# Patient Record
Sex: Male | Born: 1940 | Race: White | Hispanic: No | Smoking: Never smoker
Health system: Southern US, Community
[De-identification: ages and names within clinical notes are randomized; demographics above are authoritative.]

## PROBLEM LIST (undated history)

## (undated) DIAGNOSIS — C44519 Basal cell carcinoma of skin of other part of trunk: Secondary | ICD-10-CM

## (undated) DIAGNOSIS — K219 Gastro-esophageal reflux disease without esophagitis: Secondary | ICD-10-CM

## (undated) DIAGNOSIS — J309 Allergic rhinitis, unspecified: Secondary | ICD-10-CM

## (undated) DIAGNOSIS — E559 Vitamin D deficiency, unspecified: Secondary | ICD-10-CM

## (undated) DIAGNOSIS — N400 Enlarged prostate without lower urinary tract symptoms: Secondary | ICD-10-CM

## (undated) DIAGNOSIS — R7989 Other specified abnormal findings of blood chemistry: Secondary | ICD-10-CM

## (undated) DIAGNOSIS — M5430 Sciatica, unspecified side: Secondary | ICD-10-CM

## (undated) DIAGNOSIS — I10 Essential (primary) hypertension: Secondary | ICD-10-CM

## (undated) DIAGNOSIS — N529 Male erectile dysfunction, unspecified: Secondary | ICD-10-CM

## (undated) DIAGNOSIS — M810 Age-related osteoporosis without current pathological fracture: Secondary | ICD-10-CM

## (undated) HISTORY — DX: Gastro-esophageal reflux disease without esophagitis: K21.9

## (undated) HISTORY — DX: Allergic rhinitis, unspecified: J30.9

## (undated) HISTORY — DX: Basal cell carcinoma of skin of other part of trunk: C44.519

## (undated) HISTORY — DX: Benign prostatic hyperplasia without lower urinary tract symptoms: N40.0

## (undated) HISTORY — DX: Essential (primary) hypertension: I10

## (undated) HISTORY — DX: Other specified abnormal findings of blood chemistry: R79.89

## (undated) HISTORY — PX: VASECTOMY: SHX75

## (undated) HISTORY — DX: Age-related osteoporosis without current pathological fracture: M81.0

## (undated) HISTORY — DX: Male erectile dysfunction, unspecified: N52.9

## (undated) HISTORY — DX: Vitamin D deficiency, unspecified: E55.9

## (undated) HISTORY — PX: COLONOSCOPY: SHX174

## (undated) HISTORY — DX: Sciatica, unspecified side: M54.30

---
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
## 2004-04-02 ENCOUNTER — Observation Stay (HOSPITAL_COMMUNITY): Admission: EM | Admit: 2004-04-02 | Discharge: 2004-04-03 | Payer: Self-pay | Admitting: Emergency Medicine

## 2005-03-02 DIAGNOSIS — D369 Benign neoplasm, unspecified site: Secondary | ICD-10-CM

## 2005-03-02 HISTORY — DX: Benign neoplasm, unspecified site: D36.9

## 2007-02-11 ENCOUNTER — Encounter: Admission: RE | Admit: 2007-02-11 | Discharge: 2007-02-11 | Payer: Self-pay | Admitting: Specialist

## 2007-03-30 ENCOUNTER — Encounter: Admission: RE | Admit: 2007-03-30 | Discharge: 2007-03-30 | Payer: Self-pay | Admitting: Specialist

## 2007-04-18 ENCOUNTER — Encounter: Admission: RE | Admit: 2007-04-18 | Discharge: 2007-04-18 | Payer: Self-pay | Admitting: Specialist

## 2010-07-18 NOTE — Consult Note (Signed)
[name] NO.:  [id number]   MEDICAL RECORD NO.:  [id number]          PATIENT TYPE:  INP   LOCATION:  2022                         FACILITY:  [facility]   PHYSICIAN:  Armanda Magic, M.D.     DATE OF BIRTH:  [birth year]   DATE OF CONSULTATION:  04/02/2004  DATE OF DISCHARGE:                                   CONSULTATION   REFERRING PHYSICIAN:  Dr. Kela Millin, M.D.   PRIMARY CARE PHYSICIAN:  Dr. Dayton Scrape.   REASON FOR CONSULTATION:  Chest pain.   This is a pleasant 70 year old male with a family history of coronary  disease.  Also a history of abnormal lipids, who was admitted to the  hospital by [facility] with complaints of chest pain.  Apparently,  he was lying on the floor last evening, watching TV, and when he went to get  up, he noticed mid and left-sided chest pressure with no radiation.  He did  not have any associated symptoms of nausea, vomiting, or diaphoresis.  After  about 15 minutes, the pain resolved, and the patient went to bed around 10  p.m.  During the night, each time he would move in the bed, the pain would  increase and then decrease once his position settled.  The pain continued  into the a.m. but waxed and waned, about a 2/5.  The pain would increased  with deep breathing or arm movement.  Palpation over the anterior chest  reproduces the pain.  He received one sublingual nitroglycerin in the  emergency room with only subtle decrease in the pain.   PAST MEDICAL HISTORY:  Significant for dyslipidemia, BPH, and basal cell CA.   ALLERGIES:  None.   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  He is married.  He denies tobacco  or alcohol use.  He is very active and works out at [employer] 2-3 times per  week.   MEDICATIONS:  1.  Hytrin 5 mg a day.  2.  Pepcid AC b.i.d.  3.  Zocor 20 mg a day.  4.  Aspirin 81 mg a day.  5.  Multivitamins.  6.   Metamucil.   FAMILY HISTORY:  His mother died at 63 of an MI.  The first MI was at age  62.  His father died at 66 from COPD and hypertension.  His brother had an  MI at 69.   REVIEW OF SYSTEMS:  Negative except for occasional indigestion and low back  pain.   PHYSICAL EXAMINATION:  VITAL SIGNS:  Blood pressure 119/69, pulse 76.  Afebrile.  Respirations 10.  GENERAL:  HEENT:  Benign.  NECK:  Supple without lymphadenopathy, carotid upstrokes +2, benign, no  bruits.  LUNGS:  Clear to auscultation throughout.  HEART:  Regular rate and rhythm.  No murmurs, rubs or gallops.  Normal S1  and S2.  Positive tenderness to palpation over the mid and left-sided chest.  ABDOMEN:  Soft and nontender, nondistended.  Active bowel sounds.  No  hepatosplenomegaly.  EXTREMITIES:  No edema.  Good distal pulses.   INR 1.  White cell count 4.8, platelet count 258.  Hemoglobin 15.8,  hematocrit 45, white cell count 4.8, myoglobin 68.9, MB 1.3.  Troponin less  than 0.05.  D-dimer is pending.  Sodium 139, potassium 3.8, chloride 106,  bicarb 25, BUN 12, creatinine 0.5.  LFTs normal.   EKG shows sinus rhythm with a slight T wave inversion in aVL, otherwise  normal EKG.  QTC is normal at 422 milliseconds.   ASSESSMENT:  1.  Chest pain, atypical, and reproducible with palpation.  He has a      nonspecific T wave inversion at aVL.  This is unlikely representing      underlying ischemia.  Cardiac risk factors include his age, sex,      hyperlipidemia, and family history of coronary disease.  2.  Dyslipidemia:  On Zocor.   PLAN:  1.  Would rule out myocardial infarction with serial enzymes.  If cardiac      enzymes negative x3, would plan stress Cardiolite study in the morning.      Agree with continuing subcu Lovenox and nitro at this time.  2.  Continue Zocor.      TT/MEDQ  D:  04/02/2004  T:  04/02/2004  Job:  [id number]   cc:   Dr. Dayton Scrape

## 2010-07-18 NOTE — Discharge Summary (Signed)
NAMEANDRZEJ, SCULLY NO.:  [id number]   MEDICAL RECORD NO.:  [id number]          PATIENT TYPE:  INP   LOCATION:  2022                         FACILITY:  [facility]   PHYSICIAN:  Theone Stanley, MD   DATE OF BIRTH:  1940-09-04   DATE OF ADMISSION:  04/02/2004  DATE OF DISCHARGE:                                 DISCHARGE SUMMARY   ADMISSION DIAGNOSES:  1.  Atypical chest pain.  2.  Hyperlipidemia.  3.  Benign prostatic hypertrophy.  4.  Dyspepsia.   DISCHARGE DIAGNOSES:  1.  Atypical chest pain, non cardiac in origin.  2.  Hyperlipidemia.  3.  Benign prostatic hypertrophy.  4.  Dyspepsia.   CONSULTATIONS:  Dr. Mayford Knife.   PROCEDURES AND DIAGNOSTIC TESTS:  The patient had a Cardiolite stress test  which did not show any evidence of ischemia and preliminary reports it is  normal.   HOSPITAL COURSE:  Mr. Cianci is a very pleasant 70 year old gentleman who  presented on February 1, complaining of chest pain that happened the night  before his admission.  He described it as an achey chest pain in his mid  chest.  He did not think much of it.  He went to sleep that night, however  he woke up several times with the chest pain, however he did not know  whether this was secondary to the pain or he just was waking up.  He woke up  with pain in the morning and he continued to have the pain so he decided to  come into the hospital.  The pain comes and goes but mainly happens when he  lies down.  The pain is somewhat reproducible when he pressed on this chest.  He has no radiation, no nausea, no vomiting, no diaphoresis with this.  He  states this has never happened to him before.  Because of his multiple risk  factors, including male, age, hyperlipidemia and family history, it was felt  that the patient would be admitted for rule out MI.  The patient was  admitted to telemetry.  There was no abnormalities on telemetry.  His  enzymes were negative, but cardiology was kind  enough to come by and a  Cardiolite stress test was performed which again did not show any evidence  of ischemia.  Because of this, it was felt that he could go home.  There  were no medications added to his home regimen.   DISCHARGE MEDICATIONS:  1.  Hydra 5 mg 1 p.o. q.h.s.  2.  Pepcid 20 mg 1 p.o. daily.  3.  Zocor 20 mg 1 p.o. q.h.s.  4.  Aspirin 81 mg 1 p.o. daily  5.  Multivitamin and Metamucil daily.   DISCHARGE INSTRUCTIONS:  The patient is to resume a healthy low-fat diet,  follow up with Dr. Dayton Scrape in 2-3 weeks and Dr. Mayford Knife on a p.r.n. basis.   DISPOSITION:   DISCHARGE MEDICATIONS:   SUMMARY:      AEJ/MEDQ  D:  04/03/2004  T:  04/03/2004  Job:  [id number]   cc:  Meredith Staggers, M.D.  [address], [address]  [place]  [place] [address]  Fax: [phone]

## 2010-07-18 NOTE — H&P (Signed)
[name], [name] NO.:  [id number]   MEDICAL RECORD NO.:  [id number]          PATIENT TYPE:  EMS   LOCATION:  MAJO                         FACILITY:  [facility]   PHYSICIAN:  Theone Stanley, MD   DATE OF BIRTH:  11-10-1940   DATE OF ADMISSION:  04/02/2004  DATE OF DISCHARGE:                                HISTORY & PHYSICAL   CHIEF COMPLAINT:  Chest pain.   HISTORY OF PRESENT ILLNESS:  This is a very pleasant 70 year old gentleman  who presents with chest pain since last night.  His pain is mid chest.  He  was lying on the floor when this happened and he did not think much of it so  he went to sleep.  He woke up several times last night with pain, however,  he does not state that the pain woke him up.  He woke up this morning and  with the continued pain he decided to come into the hospital at that time.  He describes the pain as achy, it kind of comes and goes, and mainly happens  when he lays down.  It does not improve with certain with certain positions.  He has no radiation, no nausea or vomiting, no diaphoresis and it does not  feel like regurgitation and it has never happened before.   PAST MEDICAL HISTORY:  1.  Hyperlipidemia.  2.  BPH.  3.  History of squamous cell CA.  4.  Some back pain.  5.  Dyspepsia.   SURGICAL HISTORY:  Tonsillectomy.   ALLERGIES:  NONE.   MEDICATIONS:  1.  Hytrin 5 mg one p.o. q.h.s.  2.  Pepcid 20 mg one daily.  3.  Zocor 20 mg one p.o. q.h.s.  4.  Aspirin 81 mg p.o. daily.  5.  Multivitamin one p.o. daily.  6.  Metamucil daily.   FAMILY HISTORY:  Mother passed away at age 45 from an MI, her first MI was  in her 15s.  Father passed away at age 74 from COPD, he had a history of  hypertension.  The patient has a brother who had his first MI at age 13.   SOCIAL HISTORY:  The patient lives in [place], is married and has  two grown children.  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  He denies any tobacco, alcohol or illicit drug use.   REVIEW OF SYSTEMS:  General, cardiac, respiratory, GU, GI, and endocrine  were all negative.   PHYSICAL EXAMINATION:  VITAL SIGNS:  Blood pressure 165/85, pulse 76,  temperature 98, saturating 95% on room air.  HEENT:  Head normocephalic, atraumatic.  Pupils equal round and reactive.  Oropharynx was clear.  NECK:  Supple, no lymphadenopathy.  HEART:  Regular rate and rhythm, no murmurs, rubs or gallops appreciated.  LUNGS:  Clear to auscultation bilaterally.  ABDOMEN:  Soft, nontender, non-distended.  EXTREMITIES:  No clubbing, cyanosis or edema.  2+ dorsalis pedis pulses.   LABORATORY DATA:  White count 4.8, hemoglobin 15, hematocrit 45, platelets  258,000.  PT 13, PTT 29, CK 1.3, troponin less than 0.05.  Sodium 139,  potassium 0.8, chloride 106, CO2 25, glucose 110, BUN 12, creatinine 0.9,  calcium 9.3, total protein 6.1, albumin 3.9, AST 26, ALT 21, alk phos 56,  total bilirubin 0.9.   Chest x-ray preliminary report shows mild cardiomegaly, possible  COPD/emphysema, no acute changes.   ASSESSMENT AND PLAN:  1.  Atypical chest pain.  Based on the patient's history, I do not feel that      this is typical cardiac etiology, however, because of his strong family      history he will be admitted, rule out myocardial infarction, we will ask      cardiology to help out, especially with nuclear medicine study or any      other study they feel necessary for this patient.  2.  Benign prostatic hypertrophy.  We will continue with his Hytrin.  3.  Hyperlipidemia.  We will continue with his Zocor.  4.  Dyspepsia.  We will continue with his Pepcid.      AEJ/MEDQ  D:  04/02/2004  T:  04/02/2004  Job:  [id number]   cc:   Meredith Staggers, M.D.  [address]  [place]  [place] [address]  Fax: [phone]

## 2014-06-01 ENCOUNTER — Ambulatory Visit
Admission: RE | Admit: 2014-06-01 | Discharge: 2014-06-01 | Disposition: A | Discharge status: Home / Self Care | Payer: Medicare Other | Source: Ambulatory Visit | Attending: Family Medicine | Admitting: Family Medicine

## 2014-06-01 ENCOUNTER — Other Ambulatory Visit: Payer: Self-pay | Admitting: Family Medicine

## 2014-06-01 DIAGNOSIS — R609 Edema, unspecified: Secondary | ICD-10-CM

## 2014-06-01 DIAGNOSIS — M25571 Pain in right ankle and joints of right foot: Secondary | ICD-10-CM

## 2015-07-01 DEATH — deceased

## 2016-09-30 IMAGING — CR DG ANKLE COMPLETE 3+V*R*
3 series · 3 of 3 positions shown · non-contrast
Comparison: None.

CLINICAL DATA: Medial right ankle pain and swelling for 3 weeks.

EXAM:
RIGHT ANKLE - COMPLETE 3+ VIEW

[t ankle joint ap right]
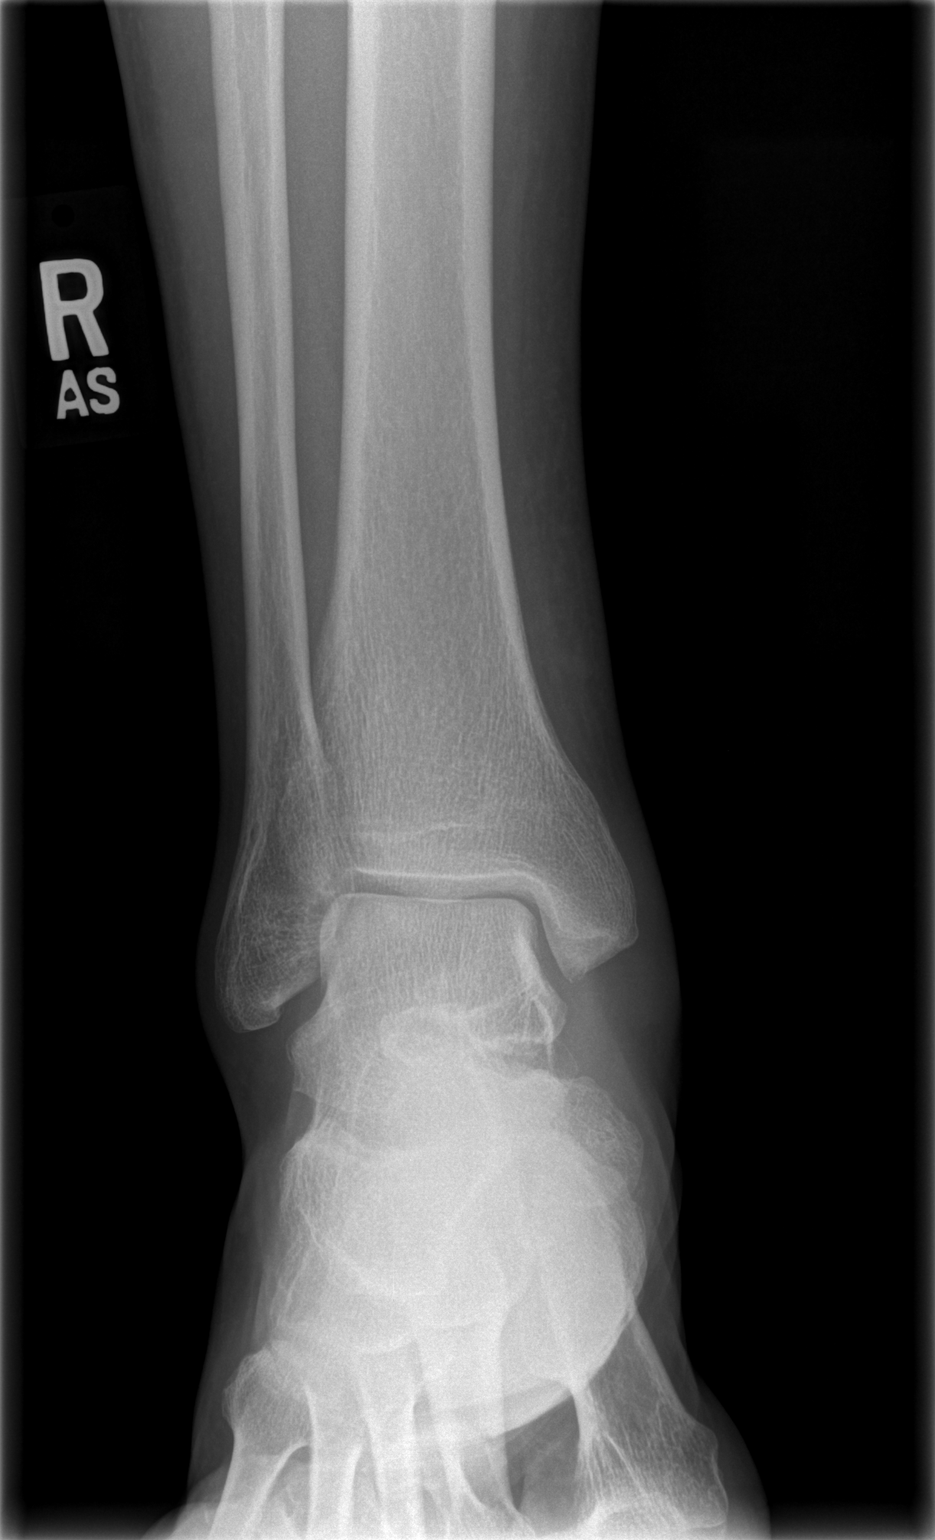

[t ankle joint oblique right]
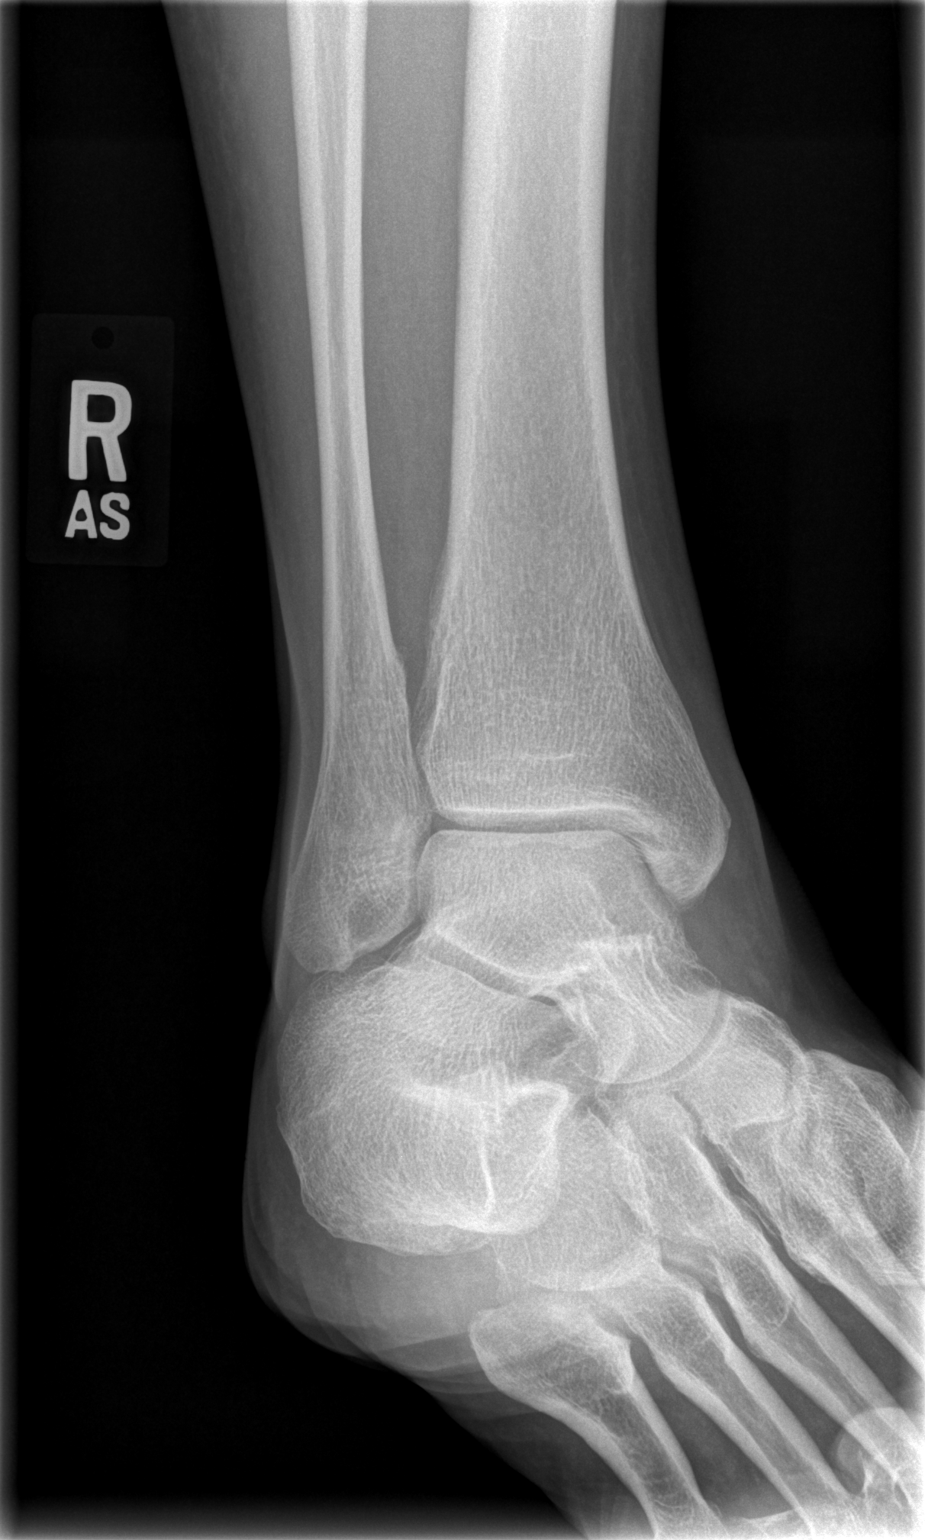

[t ankle joint lat right]
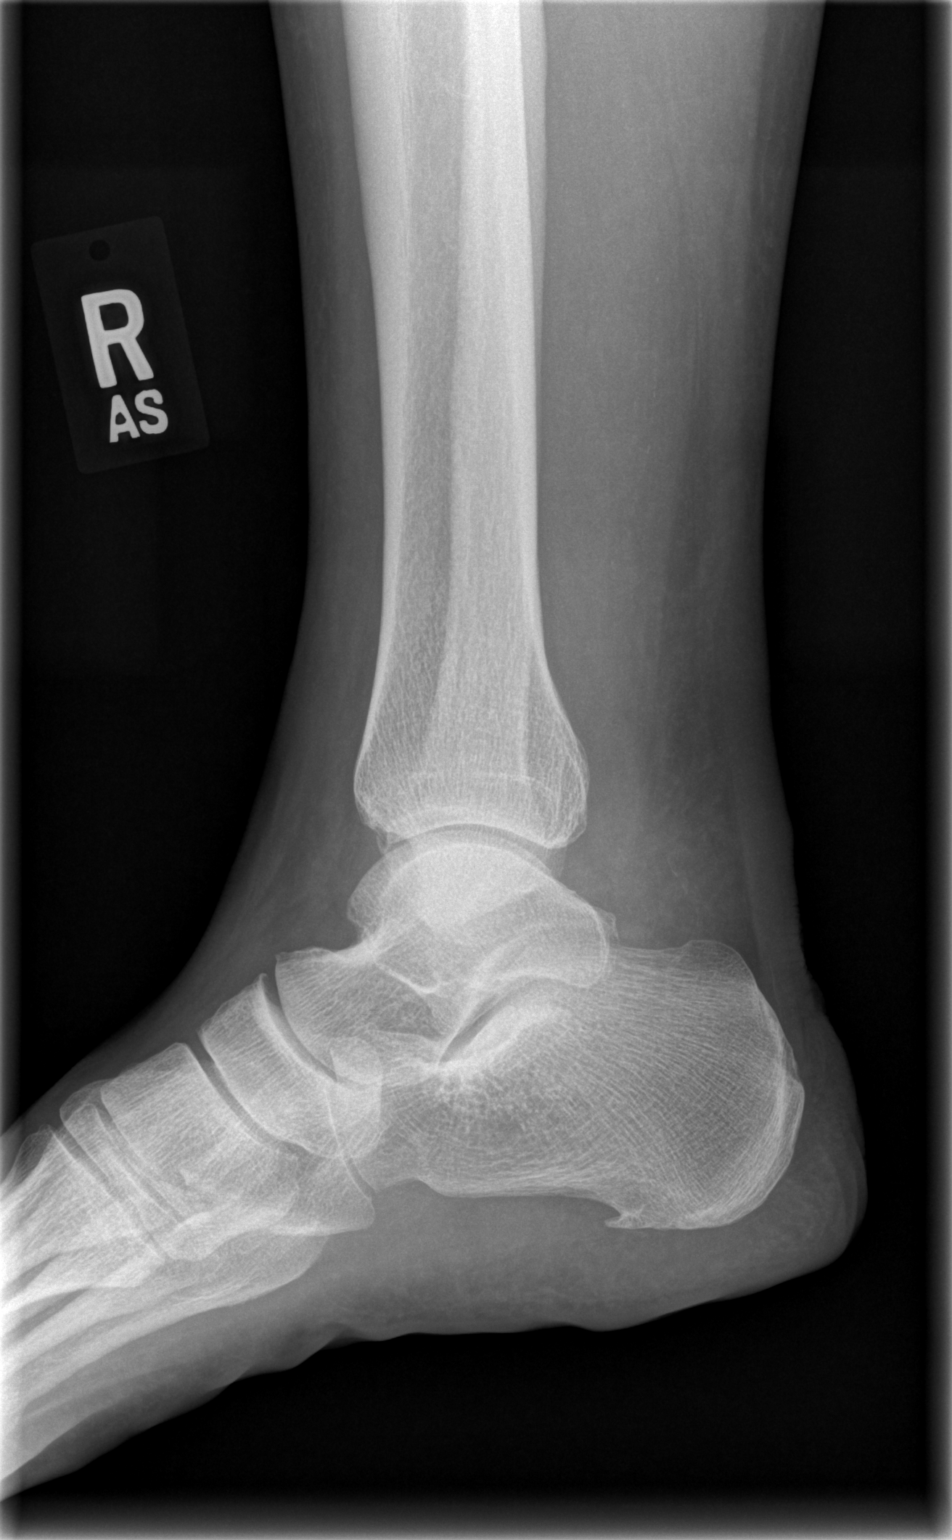

[3 of 3 positions shown; findings below may reference images not displayed]

FINDINGS: Mild soft tissue swelling along the medial ankle. Negative for
fracture or dislocation. Small spur along the plantar aspect of the
calcaneus.
IMPRESSION: Medial soft tissue swelling without acute bone abnormality.

## 2018-05-19 ENCOUNTER — Other Ambulatory Visit: Payer: Self-pay | Admitting: Family Medicine

## 2018-05-19 DIAGNOSIS — M8580 Other specified disorders of bone density and structure, unspecified site: Secondary | ICD-10-CM

## 2018-07-26 ENCOUNTER — Other Ambulatory Visit: Payer: Medicare Other

## 2018-09-09 ENCOUNTER — Ambulatory Visit
Admission: RE | Admit: 2018-09-09 | Discharge: 2018-09-09 | Disposition: A | Discharge status: Home / Self Care | Payer: Medicare Other | Source: Ambulatory Visit | Attending: Family Medicine | Admitting: Family Medicine

## 2018-09-09 ENCOUNTER — Other Ambulatory Visit: Payer: Self-pay

## 2018-09-09 DIAGNOSIS — M8580 Other specified disorders of bone density and structure, unspecified site: Secondary | ICD-10-CM

## 2019-06-20 NOTE — Progress Notes (Signed)
Reginia Forts, MD Reason for referral-chest pain and palpitations  HPI: 79 year old male for evaluation of chest pain and palpitations at request of Marijo File, MD. Patient recently was exercising at the gym.  Afterwards he felt his carotid pulse and it was irregular.  Since that time he has felt an occasional skip.  He also occasionally has discomfort in his chest when he "thinks about it".  He has dyspnea with more extreme activities but not routine activities.  No orthopnea, PND or pedal edema.  Because of the above cardiology asked to evaluate.  Note he does not have exertional chest pain.  Current Outpatient Medications  Medication Sig Dispense Refill  . aspirin 81 MG chewable tablet 1 Add'l Sig oral Select Frequency    . Calcium Carbonate-Vitamin D (CALCIUM 500 + D) 500-125 MG-UNIT TABS Take by mouth.    . Cholecalciferol (VITAMIN D) 50 MCG (2000 UT) CAPS Take by mouth.    . lidocaine (LIDODERM) 5 % lidocaine 5 % topical patch    . metoprolol succinate (TOPROL-XL) 50 MG 24 hr tablet Take 25 mg by mouth daily.    . naproxen sodium (ALEVE) 220 MG tablet 1 Add'l Sig oral Select Frequency    . omeprazole (PRILOSEC) 20 MG capsule Take 20 mg by mouth daily.    . Psyllium (METAMUCIL PO) Take by mouth.    . simvastatin (ZOCOR) 20 MG tablet simvastatin 20 mg tablet    . terazosin (HYTRIN) 5 MG capsule terazosin 5 mg capsule    . testosterone (ANDROGEL) 50 MG/5GM (1%) GEL Place 5 g onto the skin daily.    . traMADol (ULTRAM) 50 MG tablet tramadol 50 mg tablet     No current facility-administered medications for this visit.    No Known Allergies   Past Medical History:  Diagnosis Date  . Adenomatous polyp 2007  . Allergic rhinitis   . Basal cell carcinoma (BCC) of back   . BPH (benign prostatic hyperplasia)   . Erectile dysfunction   . GERD (gastroesophageal reflux disease)   . Hypertension   . Low testosterone   . Osteoporosis   . Sciatica    folowed by dr. Tonita Cong  .  Vitamin D deficiency     Past Surgical History:  Procedure Laterality Date  . COLONOSCOPY    . TONSILLECTOMY AND ADENOIDECTOMY  1947  . VASECTOMY      Social History   Socioeconomic History  . Marital status: Widowed    Spouse name: Not on file  . Number of children: 2  . Years of education: Not on file  . Highest education level: Not on file  Occupational History  . Not on file  Tobacco Use  . Smoking status: Never Smoker  . Smokeless tobacco: Never Used  Substance and Sexual Activity  . Alcohol use: Never  . Drug use: Not on file  . Sexual activity: Not on file  Other Topics Concern  . Not on file  Social History Narrative  . Not on file   Social Determinants of Health   Financial Resource Strain:   . Difficulty of Paying Living Expenses:   Food Insecurity:   . Worried About Charity fundraiser in the Last Year:   . Arboriculturist in the Last Year:   Transportation Needs:   . Film/video editor (Medical):   Marland Kitchen Lack of Transportation (Non-Medical):   Physical Activity:   . Days of Exercise per Week:   . Minutes of  Exercise per Session:   Stress:   . Feeling of Stress :   Social Connections:   . Frequency of Communication with Friends and Family:   . Frequency of Social Gatherings with Friends and Family:   . Attends Religious Services:   . Active Member of Clubs or Organizations:   . Attends Archivist Meetings:   Marland Kitchen Marital Status:   Intimate Partner Violence:   . Fear of Current or Ex-Partner:   . Emotionally Abused:   Marland Kitchen Physically Abused:   . Sexually Abused:     Family History  Problem Relation Age of Onset  . Heart disease Mother   . Hypertension Father   . Heart disease Brother   . Heart attack Brother     ROS: no fevers or chills, productive cough, hemoptysis, dysphasia, odynophagia, melena, hematochezia, dysuria, hematuria, rash, seizure activity, orthopnea, PND, pedal edema, claudication. Remaining systems are  negative.  Physical Exam:   Blood pressure (!) 168/90, pulse 65, height 5\' 10"  (1.778 m), weight 207 lb (93.9 kg), SpO2 97 %.  General:  Well developed/well nourished in NAD Skin warm/dry Patient not depressed No peripheral clubbing Back-normal HEENT-normal/normal eyelids Neck supple/normal carotid upstroke bilaterally; no bruits; no JVD; no thyromegaly chest - CTA/ normal expansion CV - RRR/normal S1 and S2; no murmurs, rubs or gallops;  PMI nondisplaced Abdomen -NT/ND, no HSM, no mass, + bowel sounds, no bruit 2+ femoral pulses, no bruits Ext-no edema, chords, 2+ DP Neuro-grossly nonfocal  ECG -normal sinus rhythm, right bundle branch block.  Personally reviewed  A/P  1 chest pain-symptoms are atypical.  We will arrange a [place] nuclear study for risk stratification.  2 palpitations-continue Toprol.  Check LV function with echocardiogram.  3 hypertension-patient's blood pressure is elevated and this is a recent diagnosis.  We will continue with low-dose Toprol given history of palpitations.  Add losartan 50 mg daily.  Check potassium and renal function in 1 week.  We will advance medical regimen as needed.  Kirk Ruths, MD

## 2019-06-26 ENCOUNTER — Encounter: Payer: Self-pay | Admitting: Cardiology

## 2019-06-26 ENCOUNTER — Ambulatory Visit: Payer: Medicare Other | Admitting: Cardiology

## 2019-06-26 ENCOUNTER — Other Ambulatory Visit: Payer: Self-pay

## 2019-06-26 VITALS — BP 168/90 | HR 65 | Ht 70.0 in | Wt 207.0 lb

## 2019-06-26 DIAGNOSIS — R072 Precordial pain: Secondary | ICD-10-CM | POA: Diagnosis not present

## 2019-06-26 DIAGNOSIS — I10 Essential (primary) hypertension: Secondary | ICD-10-CM | POA: Diagnosis not present

## 2019-06-26 DIAGNOSIS — R002 Palpitations: Secondary | ICD-10-CM

## 2019-06-26 MED ORDER — LOSARTAN POTASSIUM 50 MG PO TABS: 50.0000 mg | ORAL_TABLET | Freq: Every day | ORAL | 3 refills | Status: AC

## 2019-06-26 NOTE — Patient Instructions (Signed)
Medication Instructions:  START LOSARTAN 50MG  ONCE DAILY  *If you need a refill on your cardiac medications before your next appointment, please call your pharmacy*   Lab Work: Your physician recommends that you return for lab work in: [place] If you have labs (blood work) drawn today and your tests are completely normal, you will receive your results only by: Marland Kitchen MyChart Message (if you have MyChart) OR . A paper copy in the mail If you have any lab test that is abnormal or we need to change your treatment, we will call you to review the results.   Testing/Procedures: Your physician has requested that you have an echocardiogram. Echocardiography is a painless test that uses sound waves to create images of your heart. It provides your doctor with information about the size and shape of your heart and how well your heart's chambers and valves are working. This procedure takes approximately one hour. There are no restrictions for this procedure. [facility] has requested that you have a lexiscan myoview. For further information please visit [email or web address]. Please follow instruction sheet, as given. [place]    Follow-Up: At [facility], you and your health needs are our priority.  As part of our continuing mission to provide you with exceptional heart care, we have created designated Provider Care Teams.  These Care Teams include your primary Cardiologist (physician) and Advanced Practice Providers (APPs -  Physician Assistants and Nurse Practitioners) who all work together to provide you with the care you need, when you need it.  We recommend signing up for the patient portal called "MyChart".  Sign up information is provided on this After Visit Summary.  MyChart is used to connect with patients for Virtual Visits (Telemedicine).  Patients are able to view lab/test results, encounter notes, upcoming appointments, etc.  Non-urgent messages can be  sent to your provider as well.   To learn more about what you can do with MyChart, go to [email or web address].    Your next appointment:   3 month(s)  The format for your next appointment:   In Person  Provider:   Kirk Ruths, MD

## 2019-07-03 LAB — BASIC METABOLIC PANEL
BUN/Creatinine Ratio: 14 (ref 10–24)
BUN: 13 mg/dL (ref 8–27)
CO2: 25 mmol/L (ref 20–29)
Calcium: 9.7 mg/dL (ref 8.6–10.2)
Chloride: 100 mmol/L (ref 96–106)
Creatinine, Ser: 0.94 mg/dL (ref 0.76–1.27)
GFR calc Af Amer: 89 mL/min/{1.73_m2} (ref >59)
GFR calc non Af Amer: 77 mL/min/{1.73_m2} (ref >59)
Glucose: 158 mg/dL — ABNORMAL HIGH (ref 65–99)
Potassium: 4.3 mmol/L (ref 3.5–5.2)
Sodium: 138 mmol/L (ref 134–144)

## 2019-07-04 ENCOUNTER — Other Ambulatory Visit (INDEPENDENT_AMBULATORY_CARE_PROVIDER_SITE_OTHER): Payer: Medicare Other

## 2019-07-04 ENCOUNTER — Telehealth (HOSPITAL_COMMUNITY): Payer: Self-pay

## 2019-07-04 DIAGNOSIS — I10 Essential (primary) hypertension: Secondary | ICD-10-CM | POA: Diagnosis not present

## 2019-07-04 NOTE — Telephone Encounter (Signed)
Attempted to make contact with the patient for his instructions for his stress test. There was no answer. I will attempt again at a later time. S.Marshall Roehrich EMTP

## 2019-07-06 ENCOUNTER — Ambulatory Visit (HOSPITAL_COMMUNITY): Payer: Medicare Other | Attending: Cardiology

## 2019-07-06 ENCOUNTER — Other Ambulatory Visit: Payer: Self-pay

## 2019-07-06 DIAGNOSIS — R072 Precordial pain: Secondary | ICD-10-CM | POA: Diagnosis present

## 2019-07-06 LAB — MYOCARDIAL PERFUSION IMAGING
LV dias vol: 120 mL (ref 62–150)
LV sys vol: 64 mL
Peak HR: 86 {beats}/min
Rest HR: 58 {beats}/min
SDS: 1
SRS: 0
SSS: 1
TID: 1.02

## 2019-07-06 MED ORDER — REGADENOSON 0.4 MG/5ML IV SOLN
0.4000 mg | Freq: Once | INTRAVENOUS | Status: AC
  Administered 2019-07-06: 0.4 mg via INTRAVENOUS

## 2019-07-06 MED ORDER — TECHNETIUM TC 99M TETROFOSMIN IV KIT
30.7000 | PACK | Freq: Once | INTRAVENOUS | Status: AC | PRN
  Administered 2019-07-06: 30.7 via INTRAVENOUS
  Filled 2019-07-06: qty 31

## 2019-07-06 MED ORDER — TECHNETIUM TC 99M TETROFOSMIN IV KIT
10.6000 | PACK | Freq: Once | INTRAVENOUS | Status: AC | PRN
  Administered 2019-07-06: 10.6 via INTRAVENOUS
  Filled 2019-07-06: qty 11

## 2019-07-12 ENCOUNTER — Ambulatory Visit (HOSPITAL_COMMUNITY): Payer: Medicare Other | Attending: Internal Medicine

## 2019-07-12 ENCOUNTER — Other Ambulatory Visit: Payer: Self-pay

## 2019-07-12 DIAGNOSIS — R002 Palpitations: Secondary | ICD-10-CM | POA: Insufficient documentation

## 2019-07-12 DIAGNOSIS — I119 Hypertensive heart disease without heart failure: Secondary | ICD-10-CM | POA: Insufficient documentation

## 2019-07-12 DIAGNOSIS — I059 Rheumatic mitral valve disease, unspecified: Secondary | ICD-10-CM | POA: Diagnosis not present

## 2019-07-12 DIAGNOSIS — R072 Precordial pain: Secondary | ICD-10-CM | POA: Insufficient documentation

## 2019-09-18 NOTE — Progress Notes (Signed)
HPI: FU chest pain and palpitations. Nuclear study 5/21 showed EF 46 and no ischemia or infarct; Echo 5/21 showed normal LV function, moderate LVH, Grade 1 DD. Since last seen, patient denies dyspnea, chest pain, palpitations or syncope.  Current Outpatient Medications  Medication Sig Dispense Refill   aspirin 81 MG chewable tablet 1 Add'l Sig oral Select Frequency     Calcium Carbonate-Vitamin D (CALCIUM 500 + D) 500-125 MG-UNIT TABS Take by mouth.     Cholecalciferol (VITAMIN D) 50 MCG (2000 UT) CAPS Take by mouth.     lidocaine (LIDODERM) 5 % lidocaine 5 % topical patch     metoprolol succinate (TOPROL-XL) 50 MG 24 hr tablet Take 25 mg by mouth daily.     naproxen sodium (ALEVE) 220 MG tablet 1 Add'l Sig oral Select Frequency     omeprazole (PRILOSEC) 20 MG capsule Take 20 mg by mouth daily.     Psyllium (METAMUCIL PO) Take by mouth.     simvastatin (ZOCOR) 20 MG tablet simvastatin 20 mg tablet     terazosin (HYTRIN) 5 MG capsule terazosin 5 mg capsule     testosterone (ANDROGEL) 50 MG/5GM (1%) GEL Place 5 g onto the skin daily.     traMADol (ULTRAM) 50 MG tablet tramadol 50 mg tablet     losartan (COZAAR) 50 MG tablet Take 1 tablet (50 mg total) by mouth daily. 90 tablet 3   No current facility-administered medications for this visit.     Past Medical History:  Diagnosis Date   Adenomatous polyp 2007   Allergic rhinitis    Basal cell carcinoma (BCC) of back    BPH (benign prostatic hyperplasia)    Erectile dysfunction    GERD (gastroesophageal reflux disease)    Hypertension    Low testosterone    Osteoporosis    Sciatica    folowed by dr. Tonita Cong   Vitamin D deficiency     Past Surgical History:  Procedure Laterality Date   COLONOSCOPY     TONSILLECTOMY AND ADENOIDECTOMY  1947   VASECTOMY      Social History   Socioeconomic History   Marital status: Widowed    Spouse name: Not on file   Number of children: 2   Years of  education: Not on file   Highest education level: Not on file  Occupational History   Not on file  Tobacco Use   Smoking status: Never Smoker   Smokeless tobacco: Never Used  Substance and Sexual Activity   Alcohol use: Never   Drug use: Not on file   Sexual activity: Not on file  Other Topics Concern   Not on file  Social History Narrative   Not on file   Social Determinants of Health   Financial Resource Strain:    Difficulty of Paying Living Expenses:   Food Insecurity:    Worried About [place] in the Last Year:    Arboriculturist in the Last Year:   Transportation Needs:    Film/video editor (Medical):    Lack of Transportation (Non-Medical):   Physical Activity:    Days of Exercise per Week:    Minutes of Exercise per Session:   Stress:    Feeling of Stress :   Social Connections:    Frequency of Communication with Friends and Family:    Frequency of Social Gatherings with Friends and Family:    Attends Religious Services:    Active Member  of Clubs or Organizations:    Attends Music therapist:    Marital Status:   Intimate Partner Violence:    Fear of Current or Ex-Partner:    Emotionally Abused:    Physically Abused:    Sexually Abused:     Family History  Problem Relation Age of Onset   Heart disease Mother    Hypertension Father    Heart disease Brother    Heart attack Brother     ROS: no fevers or chills, productive cough, hemoptysis, dysphasia, odynophagia, melena, hematochezia, dysuria, hematuria, rash, seizure activity, orthopnea, PND, pedal edema, claudication. Remaining systems are negative.  Physical Exam: Well-developed well-nourished in no acute distress.  Skin is warm and dry.  HEENT is normal.  Neck is supple.  Chest is clear to auscultation with normal expansion.  Cardiovascular exam is regular rate and rhythm.  Abdominal exam nontender or distended. No masses  palpated. Extremities show no edema. neuro grossly intact   A/P  1 CP-previous symptoms atypical and no chest pain at present; nuclear study shows no ischemia; no further evaluation at this point.  2 Palpitations-improved; he only feels his heart skip if he checks his pulse; continue toprol.  3 hypertension-BP controlled; continue present meds and follow.  Kirk Ruths, MD

## 2019-09-25 ENCOUNTER — Ambulatory Visit: Payer: Medicare Other | Admitting: Cardiology

## 2019-09-25 ENCOUNTER — Encounter: Payer: Self-pay | Admitting: Cardiology

## 2019-09-25 ENCOUNTER — Other Ambulatory Visit: Payer: Self-pay

## 2019-09-25 VITALS — BP 136/74 | HR 63 | Ht 70.0 in | Wt 209.6 lb

## 2019-09-25 DIAGNOSIS — I10 Essential (primary) hypertension: Secondary | ICD-10-CM

## 2019-09-25 DIAGNOSIS — R002 Palpitations: Secondary | ICD-10-CM | POA: Diagnosis not present

## 2019-09-25 DIAGNOSIS — R072 Precordial pain: Secondary | ICD-10-CM

## 2019-09-25 NOTE — Patient Instructions (Signed)
Medication Instructions:  NO CHANGE *If you need a refill on your cardiac medications before your next appointment, please call your pharmacy*   Lab Work: If you have labs (blood work) drawn today and your tests are completely normal, you will receive your results only by: . MyChart Message (if you have MyChart) OR . A paper copy in the mail If you have any lab test that is abnormal or we need to change your treatment, we will call you to review the results.   Follow-Up: At [facility], you and your health needs are our priority.  As part of our continuing mission to provide you with exceptional heart care, we have created designated Provider Care Teams.  These Care Teams include your primary Cardiologist (physician) and Advanced Practice Providers (APPs -  Physician Assistants and Nurse Practitioners) who all work together to provide you with the care you need, when you need it.  We recommend signing up for the patient portal called "MyChart".  Sign up information is provided on this After Visit Summary.  MyChart is used to connect with patients for Virtual Visits (Telemedicine).  Patients are able to view lab/test results, encounter notes, upcoming appointments, etc.  Non-urgent messages can be sent to your provider as well.   To learn more about what you can do with MyChart, go to [email or web address].    Your next appointment:   12 month(s)  The format for your next appointment:   In Person  Provider:   You may see BRIAN CRENSHAW MD or one of the following Advanced Practice Providers on your designated Care Team:    Luke Kilroy, PA-C  Callie Goodrich, PA-C  Jesse Cleaver, FNP     

## 2020-09-13 NOTE — Progress Notes (Signed)
HPI: FU chest pain and palpitations. Nuclear study 5/21 showed EF 46 and no ischemia or infarct; Echo 5/21 showed normal LV function, moderate LVH, Grade 1 DD. Since last seen, the patient denies any dyspnea on exertion, orthopnea, PND, pedal edema, palpitations, syncope or chest pain.   Current Outpatient Medications  Medication Sig Dispense Refill   aspirin 81 MG chewable tablet 1 Add'l Sig oral Select Frequency     Calcium Carbonate-Vitamin D (CALCIUM 500 + D) 500-125 MG-UNIT TABS Take by mouth.     Cholecalciferol (VITAMIN D) 50 MCG (2000 UT) CAPS Take by mouth.     lidocaine (LIDODERM) 5 % lidocaine 5 % topical patch     metoprolol succinate (TOPROL-XL) 50 MG 24 hr tablet Take 25 mg by mouth daily.     naproxen sodium (ALEVE) 220 MG tablet 1 Add'l Sig oral Select Frequency     omeprazole (PRILOSEC) 20 MG capsule Take 20 mg by mouth daily.     Psyllium (METAMUCIL PO) Take by mouth.     simvastatin (ZOCOR) 20 MG tablet simvastatin 20 mg tablet     terazosin (HYTRIN) 5 MG capsule terazosin 5 mg capsule     testosterone (ANDROGEL) 50 MG/5GM (1%) GEL Place 5 g onto the skin daily.     traMADol (ULTRAM) 50 MG tablet tramadol 50 mg tablet     losartan (COZAAR) 50 MG tablet Take 1 tablet (50 mg total) by mouth daily. 90 tablet 3   No current facility-administered medications for this visit.     Past Medical History:  Diagnosis Date   Adenomatous polyp 2007   Allergic rhinitis    Basal cell carcinoma (BCC) of back    BPH (benign prostatic hyperplasia)    Erectile dysfunction    GERD (gastroesophageal reflux disease)    Hypertension    Low testosterone    Osteoporosis    Sciatica    folowed by dr. Tonita Cong   Vitamin D deficiency     Past Surgical History:  Procedure Laterality Date   COLONOSCOPY     TONSILLECTOMY AND ADENOIDECTOMY  1947   VASECTOMY      Social History   Socioeconomic History   Marital status: Widowed    Spouse name: Not on file   Number of  children: 2   Years of education: Not on file   Highest education level: Not on file  Occupational History   Not on file  Tobacco Use   Smoking status: Never   Smokeless tobacco: Never  Substance and Sexual Activity   Alcohol use: Never   Drug use: Not on file   Sexual activity: Not on file  Other Topics Concern   Not on file  Social History Narrative   Not on file   Social Determinants of Health   Financial Resource Strain: Not on file  Food Insecurity: Not on file  Transportation Needs: Not on file  Physical Activity: Not on file  Stress: Not on file  Social Connections: Not on file  Intimate Partner Violence: Not on file    Family History  Problem Relation Age of Onset   Heart disease Mother    Hypertension Father    Heart disease Brother    Heart attack Brother     ROS: no fevers or chills, productive cough, hemoptysis, dysphasia, odynophagia, melena, hematochezia, dysuria, hematuria, rash, seizure activity, orthopnea, PND, pedal edema, claudication. Remaining systems are negative.  Physical Exam: Well-developed well-nourished in no acute distress.  Skin  is warm and dry.  HEENT is normal.  Neck is supple.  Chest is clear to auscultation with normal expansion.  Cardiovascular exam is regular rate and rhythm.  Abdominal exam nontender or distended. No masses palpated. Extremities show no edema. neuro grossly intact  ECG-normal sinus rhythm at a rate of 70, right bundle branch block, no ST changes.  Personally reviewed  A/P  1 chest pain-previous symptoms felt to be atypical; no recurrences.  Nuclear study showed no ischemia.    2 hypertension-blood pressure controlled.  Continue present medical regimen.  3 palpitations-improved.  We will continue beta-blocker at present dose.  Kirk Ruths, MD

## 2020-09-18 ENCOUNTER — Encounter: Payer: Self-pay | Admitting: Cardiology

## 2020-09-18 ENCOUNTER — Ambulatory Visit: Payer: Medicare Other | Admitting: Cardiology

## 2020-09-18 ENCOUNTER — Other Ambulatory Visit: Payer: Self-pay

## 2020-09-18 VITALS — BP 130/72 | HR 70 | Ht 70.0 in | Wt 209.0 lb

## 2020-09-18 DIAGNOSIS — R002 Palpitations: Secondary | ICD-10-CM

## 2020-09-18 DIAGNOSIS — I10 Essential (primary) hypertension: Secondary | ICD-10-CM | POA: Diagnosis not present

## 2020-09-18 DIAGNOSIS — R072 Precordial pain: Secondary | ICD-10-CM

## 2020-09-18 NOTE — Patient Instructions (Signed)

## 2021-03-18 DIAGNOSIS — H2511 Age-related nuclear cataract, right eye: Secondary | ICD-10-CM | POA: Diagnosis not present

## 2021-03-18 DIAGNOSIS — H2513 Age-related nuclear cataract, bilateral: Secondary | ICD-10-CM | POA: Diagnosis not present

## 2021-03-18 DIAGNOSIS — H25043 Posterior subcapsular polar age-related cataract, bilateral: Secondary | ICD-10-CM | POA: Diagnosis not present

## 2021-03-18 DIAGNOSIS — H18413 Arcus senilis, bilateral: Secondary | ICD-10-CM | POA: Diagnosis not present

## 2021-03-18 DIAGNOSIS — H35373 Puckering of macula, bilateral: Secondary | ICD-10-CM | POA: Diagnosis not present

## 2021-04-02 DIAGNOSIS — H35373 Puckering of macula, bilateral: Secondary | ICD-10-CM | POA: Diagnosis not present

## 2021-04-02 DIAGNOSIS — H2513 Age-related nuclear cataract, bilateral: Secondary | ICD-10-CM | POA: Diagnosis not present

## 2021-04-02 DIAGNOSIS — H4321 Crystalline deposits in vitreous body, right eye: Secondary | ICD-10-CM | POA: Diagnosis not present

## 2021-04-02 DIAGNOSIS — H43813 Vitreous degeneration, bilateral: Secondary | ICD-10-CM | POA: Diagnosis not present

## 2021-04-09 DIAGNOSIS — D225 Melanocytic nevi of trunk: Secondary | ICD-10-CM | POA: Diagnosis not present

## 2021-04-09 DIAGNOSIS — L570 Actinic keratosis: Secondary | ICD-10-CM | POA: Diagnosis not present

## 2021-04-09 DIAGNOSIS — Z08 Encounter for follow-up examination after completed treatment for malignant neoplasm: Secondary | ICD-10-CM | POA: Diagnosis not present

## 2021-04-09 DIAGNOSIS — L814 Other melanin hyperpigmentation: Secondary | ICD-10-CM | POA: Diagnosis not present

## 2021-04-09 DIAGNOSIS — L819 Disorder of pigmentation, unspecified: Secondary | ICD-10-CM | POA: Diagnosis not present

## 2021-04-09 DIAGNOSIS — L578 Other skin changes due to chronic exposure to nonionizing radiation: Secondary | ICD-10-CM | POA: Diagnosis not present

## 2021-04-09 DIAGNOSIS — Z85828 Personal history of other malignant neoplasm of skin: Secondary | ICD-10-CM | POA: Diagnosis not present

## 2021-04-09 DIAGNOSIS — L821 Other seborrheic keratosis: Secondary | ICD-10-CM | POA: Diagnosis not present

## 2021-06-02 DIAGNOSIS — H3581 Retinal edema: Secondary | ICD-10-CM | POA: Diagnosis not present

## 2021-06-02 DIAGNOSIS — H35371 Puckering of macula, right eye: Secondary | ICD-10-CM | POA: Diagnosis not present

## 2021-06-02 DIAGNOSIS — H2511 Age-related nuclear cataract, right eye: Secondary | ICD-10-CM | POA: Diagnosis not present

## 2021-06-03 DIAGNOSIS — H4311 Vitreous hemorrhage, right eye: Secondary | ICD-10-CM | POA: Diagnosis not present

## 2021-06-10 DIAGNOSIS — H35373 Puckering of macula, bilateral: Secondary | ICD-10-CM | POA: Diagnosis not present

## 2021-06-10 DIAGNOSIS — H3581 Retinal edema: Secondary | ICD-10-CM | POA: Diagnosis not present

## 2021-07-01 DIAGNOSIS — H35361 Drusen (degenerative) of macula, right eye: Secondary | ICD-10-CM | POA: Diagnosis not present

## 2021-07-01 DIAGNOSIS — H35373 Puckering of macula, bilateral: Secondary | ICD-10-CM | POA: Diagnosis not present

## 2021-07-01 DIAGNOSIS — H3581 Retinal edema: Secondary | ICD-10-CM | POA: Diagnosis not present

## 2021-07-01 DIAGNOSIS — H43812 Vitreous degeneration, left eye: Secondary | ICD-10-CM | POA: Diagnosis not present

## 2021-07-24 DIAGNOSIS — E559 Vitamin D deficiency, unspecified: Secondary | ICD-10-CM | POA: Diagnosis not present

## 2021-07-24 DIAGNOSIS — E039 Hypothyroidism, unspecified: Secondary | ICD-10-CM | POA: Diagnosis not present

## 2021-07-24 DIAGNOSIS — E782 Mixed hyperlipidemia: Secondary | ICD-10-CM | POA: Diagnosis not present

## 2021-07-24 DIAGNOSIS — G4700 Insomnia, unspecified: Secondary | ICD-10-CM | POA: Diagnosis not present

## 2021-07-24 DIAGNOSIS — I10 Essential (primary) hypertension: Secondary | ICD-10-CM | POA: Diagnosis not present

## 2021-07-24 DIAGNOSIS — M545 Low back pain, unspecified: Secondary | ICD-10-CM | POA: Diagnosis not present

## 2021-07-24 DIAGNOSIS — K219 Gastro-esophageal reflux disease without esophagitis: Secondary | ICD-10-CM | POA: Diagnosis not present

## 2021-10-08 DIAGNOSIS — L578 Other skin changes due to chronic exposure to nonionizing radiation: Secondary | ICD-10-CM | POA: Diagnosis not present

## 2021-10-08 DIAGNOSIS — L570 Actinic keratosis: Secondary | ICD-10-CM | POA: Diagnosis not present

## 2021-10-08 DIAGNOSIS — Z85828 Personal history of other malignant neoplasm of skin: Secondary | ICD-10-CM | POA: Diagnosis not present

## 2021-10-08 DIAGNOSIS — Z08 Encounter for follow-up examination after completed treatment for malignant neoplasm: Secondary | ICD-10-CM | POA: Diagnosis not present

## 2021-10-08 DIAGNOSIS — L814 Other melanin hyperpigmentation: Secondary | ICD-10-CM | POA: Diagnosis not present

## 2021-12-15 DIAGNOSIS — L570 Actinic keratosis: Secondary | ICD-10-CM | POA: Diagnosis not present

## 2021-12-15 DIAGNOSIS — L578 Other skin changes due to chronic exposure to nonionizing radiation: Secondary | ICD-10-CM | POA: Diagnosis not present

## 2022-02-02 DIAGNOSIS — H35033 Hypertensive retinopathy, bilateral: Secondary | ICD-10-CM | POA: Diagnosis not present

## 2022-02-02 DIAGNOSIS — H5203 Hypermetropia, bilateral: Secondary | ICD-10-CM | POA: Diagnosis not present

## 2022-02-02 DIAGNOSIS — H43393 Other vitreous opacities, bilateral: Secondary | ICD-10-CM | POA: Diagnosis not present

## 2022-02-02 DIAGNOSIS — H52223 Regular astigmatism, bilateral: Secondary | ICD-10-CM | POA: Diagnosis not present

## 2022-02-02 DIAGNOSIS — H53143 Visual discomfort, bilateral: Secondary | ICD-10-CM | POA: Diagnosis not present

## 2022-02-02 DIAGNOSIS — H4323 Crystalline deposits in vitreous body, bilateral: Secondary | ICD-10-CM | POA: Diagnosis not present

## 2022-02-02 DIAGNOSIS — H2513 Age-related nuclear cataract, bilateral: Secondary | ICD-10-CM | POA: Diagnosis not present

## 2022-02-17 DIAGNOSIS — L570 Actinic keratosis: Secondary | ICD-10-CM | POA: Diagnosis not present

## 2022-03-05 NOTE — Progress Notes (Signed)
HPI: FU chest pain and palpitations. Nuclear study 5/21 showed EF 46 and no ischemia or infarct; Echo 5/21 showed normal LV function, moderate LVH, Grade 1 DD. Since last seen, patient denies dyspnea on exertion, orthopnea, PND, pedal edema, chest pain or syncope.  Current Outpatient Medications  Medication Sig Dispense Refill   aspirin 81 MG chewable tablet 1 Add'l Sig oral Select Frequency     Calcium Carbonate-Vitamin D (CALCIUM 500 + D) 500-125 MG-UNIT TABS Take by mouth.     Cholecalciferol (VITAMIN D) 50 MCG (2000 UT) CAPS Take by mouth.     lidocaine (LIDODERM) 5 % lidocaine 5 % topical patch     losartan (COZAAR) 50 MG tablet Take 1 tablet (50 mg total) by mouth daily. 90 tablet 3   metoprolol succinate (TOPROL-XL) 50 MG 24 hr tablet Take 25 mg by mouth daily.     naproxen sodium (ALEVE) 220 MG tablet 1 Add'l Sig oral Select Frequency     omeprazole (PRILOSEC) 20 MG capsule Take 20 mg by mouth daily.     Psyllium (METAMUCIL PO) Take by mouth.     simvastatin (ZOCOR) 20 MG tablet simvastatin 20 mg tablet     terazosin (HYTRIN) 5 MG capsule terazosin 5 mg capsule     testosterone (ANDROGEL) 50 MG/5GM (1%) GEL Place 5 g onto the skin daily.     traMADol (ULTRAM) 50 MG tablet tramadol 50 mg tablet     No current facility-administered medications for this visit.     Past Medical History:  Diagnosis Date   Adenomatous polyp 2007   Allergic rhinitis    Basal cell carcinoma (BCC) of back    BPH (benign prostatic hyperplasia)    Erectile dysfunction    GERD (gastroesophageal reflux disease)    Hypertension    Low testosterone    Osteoporosis    Sciatica    folowed by dr. Tonita Cong   Vitamin D deficiency     Past Surgical History:  Procedure Laterality Date   COLONOSCOPY     TONSILLECTOMY AND ADENOIDECTOMY  1947   VASECTOMY      Social History   Socioeconomic History   Marital status: Widowed    Spouse name: Not on file   Number of children: 2   Years of  education: Not on file   Highest education level: Not on file  Occupational History   Not on file  Tobacco Use   Smoking status: Never   Smokeless tobacco: Never  Substance and Sexual Activity   Alcohol use: Never   Drug use: Not on file   Sexual activity: Not on file  Other Topics Concern   Not on file  Social History Narrative   Not on file   Social Determinants of Health   Financial Resource Strain: Not on file  Food Insecurity: Not on file  Transportation Needs: Not on file  Physical Activity: Not on file  Stress: Not on file  Social Connections: Not on file  Intimate Partner Violence: Not on file    Family History  Problem Relation Age of Onset   Heart disease Mother    Hypertension Father    Heart disease Brother    Heart attack Brother     ROS: no fevers or chills, productive cough, hemoptysis, dysphasia, odynophagia, melena, hematochezia, dysuria, hematuria, rash, seizure activity, orthopnea, PND, pedal edema, claudication. Remaining systems are negative.  Physical Exam: Well-developed well-nourished in no acute distress.  Skin is warm and dry.  HEENT is normal.  Neck is supple.  Chest is clear to auscultation with normal expansion.  Cardiovascular exam is regular rate and rhythm.  Abdominal exam nontender or distended. No masses palpated. Extremities show no edema. neuro grossly intact  ECG-normal sinus rhythm, right bundle branch block, cannot rule out prior inferior infarct.  Personally reviewed  A/P  1 history of chest pain-patient has had no recurrences since last office visit.  Previous nuclear study showed no ischemia.  Plan to continue aspirin and statin.  He does have a family history of coronary disease.  I will arrange a calcium score for risk stratification.  His electrocardiogram shows cannot rule out prior inferior infarct.  I will arrange an echocardiogram to assess wall motion.  2 hypertension-patient's blood pressure is elevated.  However  typically controlled.  Continue present medications and follow-up.  3 palpitations-symptoms are well-controlled.  Continue beta-blocker.  4 hyperlipidemia-continue statin.  If calcium noted in his coronary arteries we will change to Crestor.  Kirk Ruths, MD

## 2022-03-12 DIAGNOSIS — G4700 Insomnia, unspecified: Secondary | ICD-10-CM | POA: Diagnosis not present

## 2022-03-12 DIAGNOSIS — I10 Essential (primary) hypertension: Secondary | ICD-10-CM | POA: Diagnosis not present

## 2022-03-12 DIAGNOSIS — K219 Gastro-esophageal reflux disease without esophagitis: Secondary | ICD-10-CM | POA: Diagnosis not present

## 2022-03-12 DIAGNOSIS — M545 Low back pain, unspecified: Secondary | ICD-10-CM | POA: Diagnosis not present

## 2022-03-12 DIAGNOSIS — E782 Mixed hyperlipidemia: Secondary | ICD-10-CM | POA: Diagnosis not present

## 2022-03-12 DIAGNOSIS — E559 Vitamin D deficiency, unspecified: Secondary | ICD-10-CM | POA: Diagnosis not present

## 2022-03-12 DIAGNOSIS — Z0000 Encounter for general adult medical examination without abnormal findings: Secondary | ICD-10-CM | POA: Diagnosis not present

## 2022-03-12 DIAGNOSIS — Z634 Disappearance and death of family member: Secondary | ICD-10-CM | POA: Diagnosis not present

## 2022-03-12 DIAGNOSIS — E039 Hypothyroidism, unspecified: Secondary | ICD-10-CM | POA: Diagnosis not present

## 2022-03-19 ENCOUNTER — Ambulatory Visit: Payer: Medicare Other | Attending: Cardiology | Admitting: Cardiology

## 2022-03-19 ENCOUNTER — Encounter: Payer: Self-pay | Admitting: Cardiology

## 2022-03-19 VITALS — BP 152/76 | HR 64 | Ht 69.0 in | Wt 214.0 lb

## 2022-03-19 DIAGNOSIS — I2510 Atherosclerotic heart disease of native coronary artery without angina pectoris: Secondary | ICD-10-CM | POA: Diagnosis not present

## 2022-03-19 DIAGNOSIS — E7800 Pure hypercholesterolemia, unspecified: Secondary | ICD-10-CM

## 2022-03-19 DIAGNOSIS — R002 Palpitations: Secondary | ICD-10-CM | POA: Diagnosis not present

## 2022-03-19 DIAGNOSIS — I10 Essential (primary) hypertension: Secondary | ICD-10-CM

## 2022-03-19 NOTE — Patient Instructions (Signed)
  Testing/Procedures:  CORONARY CALCIUM SCORING CT SCAN AT THE [facility] OFFICE  Your physician has requested that you have an echocardiogram. Echocardiography is a painless test that uses sound waves to create images of your heart. It provides your doctor with information about the size and shape of your heart and how well your heart's chambers and valves are working. This procedure takes approximately one hour. There are no restrictions for this procedure. Please do NOT wear cologne, perfume, aftershave, or lotions (deodorant is allowed). Please arrive 15 minutes prior to your appointment time. [facility]   Follow-Up: At [facility], you and your health needs are our priority.  As part of our continuing mission to provide you with exceptional heart care, we have created designated Provider Care Teams.  These Care Teams include your primary Cardiologist (physician) and Advanced Practice Providers (APPs -  Physician Assistants and Nurse Practitioners) who all work together to provide you with the care you need, when you need it.  We recommend signing up for the patient portal called "MyChart".  Sign up information is provided on this After Visit Summary.  MyChart is used to connect with patients for Virtual Visits (Telemedicine).  Patients are able to view lab/test results, encounter notes, upcoming appointments, etc.  Non-urgent messages can be sent to your provider as well.   To learn more about what you can do with MyChart, go to [email or web address].    Your next appointment:   12 month(s)  Provider:   Kirk Ruths MD

## 2022-04-16 ENCOUNTER — Encounter (HOSPITAL_BASED_OUTPATIENT_CLINIC_OR_DEPARTMENT_OTHER): Payer: Self-pay

## 2022-04-16 ENCOUNTER — Ambulatory Visit (INDEPENDENT_AMBULATORY_CARE_PROVIDER_SITE_OTHER): Payer: Medicare Other

## 2022-04-16 ENCOUNTER — Ambulatory Visit (HOSPITAL_BASED_OUTPATIENT_CLINIC_OR_DEPARTMENT_OTHER)
Admission: RE | Admit: 2022-04-16 | Discharge: 2022-04-16 | Disposition: A | Discharge status: Home / Self Care | Payer: Medicare Other | Source: Ambulatory Visit | Attending: Cardiology | Admitting: Cardiology

## 2022-04-16 DIAGNOSIS — R002 Palpitations: Secondary | ICD-10-CM | POA: Diagnosis not present

## 2022-04-16 DIAGNOSIS — I2510 Atherosclerotic heart disease of native coronary artery without angina pectoris: Secondary | ICD-10-CM | POA: Insufficient documentation

## 2022-04-16 LAB — ECHOCARDIOGRAM COMPLETE
Area-P 1/2: 3.08 cm2
S' Lateral: 1.85 cm

## 2022-04-17 ENCOUNTER — Encounter: Payer: Self-pay | Admitting: *Deleted

## 2022-04-17 ENCOUNTER — Encounter: Payer: Self-pay | Admitting: Cardiology

## 2022-04-17 DIAGNOSIS — E785 Hyperlipidemia, unspecified: Secondary | ICD-10-CM

## 2022-04-17 MED ORDER — ROSUVASTATIN CALCIUM 40 MG PO TABS: 40.0000 mg | ORAL_TABLET | Freq: Every day | ORAL | 3 refills | Status: AC

## 2022-04-17 NOTE — Telephone Encounter (Signed)
-----   Message from Lelon Perla, MD sent at 04/16/2022  4:20 PM EST ----- Significantly elevated Ca score. DC zocor; crestor 40 mg daily; lipids and liver 8 weeks; add asa 81 mg daily. Kirk Ruths

## 2022-04-17 NOTE — Telephone Encounter (Signed)
This encounter was created in error - please disregard.

## 2022-04-22 DIAGNOSIS — L814 Other melanin hyperpigmentation: Secondary | ICD-10-CM | POA: Diagnosis not present

## 2022-04-22 DIAGNOSIS — D492 Neoplasm of unspecified behavior of bone, soft tissue, and skin: Secondary | ICD-10-CM | POA: Diagnosis not present

## 2022-04-22 DIAGNOSIS — L570 Actinic keratosis: Secondary | ICD-10-CM | POA: Diagnosis not present

## 2022-04-22 DIAGNOSIS — L821 Other seborrheic keratosis: Secondary | ICD-10-CM | POA: Diagnosis not present

## 2022-04-22 DIAGNOSIS — D225 Melanocytic nevi of trunk: Secondary | ICD-10-CM | POA: Diagnosis not present

## 2022-04-22 DIAGNOSIS — D0339 Melanoma in situ of other parts of face: Secondary | ICD-10-CM | POA: Diagnosis not present

## 2022-05-11 DIAGNOSIS — L989 Disorder of the skin and subcutaneous tissue, unspecified: Secondary | ICD-10-CM | POA: Diagnosis not present

## 2022-05-11 DIAGNOSIS — C4339 Malignant melanoma of other parts of face: Secondary | ICD-10-CM | POA: Diagnosis not present

## 2022-05-18 DIAGNOSIS — C4339 Malignant melanoma of other parts of face: Secondary | ICD-10-CM | POA: Diagnosis not present

## 2022-06-18 DIAGNOSIS — E785 Hyperlipidemia, unspecified: Secondary | ICD-10-CM | POA: Diagnosis not present

## 2022-06-19 LAB — LIPID PANEL
Chol/HDL Ratio: 2.2 ratio (ref 0.0–5.0)
Cholesterol, Total: 123 mg/dL (ref 100–199)
HDL: 56 mg/dL (ref >39)
LDL Chol Calc (NIH): 49 mg/dL (ref 0–99)
Triglycerides: 95 mg/dL (ref 0–149)
VLDL Cholesterol Cal: 18 mg/dL (ref 5–40)

## 2022-06-19 LAB — HEPATIC FUNCTION PANEL
ALT: 22 IU/L (ref 0–44)
AST: 24 IU/L (ref 0–40)
Albumin: 4.4 g/dL (ref 3.7–4.7)
Alkaline Phosphatase: 64 IU/L (ref 44–121)
Bilirubin Total: 0.9 mg/dL (ref 0.0–1.2)
Bilirubin, Direct: 0.25 mg/dL (ref 0.00–0.40)
Total Protein: 6.3 g/dL (ref 6.0–8.5)

## 2022-06-26 ENCOUNTER — Encounter: Payer: Self-pay | Admitting: *Deleted

## 2022-08-31 DIAGNOSIS — H905 Unspecified sensorineural hearing loss: Secondary | ICD-10-CM | POA: Diagnosis not present

## 2022-09-14 DIAGNOSIS — Z85820 Personal history of malignant melanoma of skin: Secondary | ICD-10-CM | POA: Diagnosis not present

## 2022-09-14 DIAGNOSIS — E039 Hypothyroidism, unspecified: Secondary | ICD-10-CM | POA: Diagnosis not present

## 2022-09-14 DIAGNOSIS — M545 Low back pain, unspecified: Secondary | ICD-10-CM | POA: Diagnosis not present

## 2022-09-14 DIAGNOSIS — E782 Mixed hyperlipidemia: Secondary | ICD-10-CM | POA: Diagnosis not present

## 2022-09-14 DIAGNOSIS — I10 Essential (primary) hypertension: Secondary | ICD-10-CM | POA: Diagnosis not present

## 2022-09-14 DIAGNOSIS — G4700 Insomnia, unspecified: Secondary | ICD-10-CM | POA: Diagnosis not present

## 2022-09-14 DIAGNOSIS — E559 Vitamin D deficiency, unspecified: Secondary | ICD-10-CM | POA: Diagnosis not present

## 2022-09-14 DIAGNOSIS — K219 Gastro-esophageal reflux disease without esophagitis: Secondary | ICD-10-CM | POA: Diagnosis not present

## 2022-10-28 DIAGNOSIS — L570 Actinic keratosis: Secondary | ICD-10-CM | POA: Diagnosis not present

## 2023-02-12 DIAGNOSIS — H43393 Other vitreous opacities, bilateral: Secondary | ICD-10-CM | POA: Diagnosis not present

## 2023-02-12 DIAGNOSIS — H35372 Puckering of macula, left eye: Secondary | ICD-10-CM | POA: Diagnosis not present

## 2023-02-12 DIAGNOSIS — H53143 Visual discomfort, bilateral: Secondary | ICD-10-CM | POA: Diagnosis not present

## 2023-02-12 DIAGNOSIS — H524 Presbyopia: Secondary | ICD-10-CM | POA: Diagnosis not present

## 2023-02-12 DIAGNOSIS — H35033 Hypertensive retinopathy, bilateral: Secondary | ICD-10-CM | POA: Diagnosis not present

## 2023-02-12 DIAGNOSIS — H4323 Crystalline deposits in vitreous body, bilateral: Secondary | ICD-10-CM | POA: Diagnosis not present

## 2023-02-12 DIAGNOSIS — H52223 Regular astigmatism, bilateral: Secondary | ICD-10-CM | POA: Diagnosis not present

## 2023-02-12 DIAGNOSIS — H5203 Hypermetropia, bilateral: Secondary | ICD-10-CM | POA: Diagnosis not present

## 2023-02-12 DIAGNOSIS — I10 Essential (primary) hypertension: Secondary | ICD-10-CM | POA: Diagnosis not present

## 2023-02-12 DIAGNOSIS — Z9841 Cataract extraction status, right eye: Secondary | ICD-10-CM | POA: Diagnosis not present

## 2023-03-29 NOTE — Progress Notes (Signed)
 HPI: FU chest pain and palpitations. Nuclear study 5/21 showed EF 46 and no ischemia or infarct.  Calcium  score February 2024 1119 which was 69th percentile.  Echocardiogram February 2019 showed normal LV function, moderate left ventricular hypertrophy, grade 1 diastolic dysfunction, mild right ventricular enlargement.  Since last seen, the patient denies any dyspnea on exertion, orthopnea, PND, pedal edema, palpitations, syncope or chest pain.  He can exercise routinely doing aerobic activities on the treadmill, rowing machine with no symptoms.   Current Outpatient Medications  Medication Sig Dispense Refill   aspirin 81 MG chewable tablet 1 Add'l Sig oral Select Frequency     Calcium  Carbonate-Vitamin D (CALCIUM  500 + D) 500-125 MG-UNIT TABS Take by mouth.     Cholecalciferol (VITAMIN D) 50 MCG (2000 UT) CAPS Take by mouth.     levothyroxine (SYNTHROID) 25 MCG tablet Take 25 mcg by mouth every morning.     lidocaine (LIDODERM) 5 % lidocaine 5 % topical patch     metoprolol succinate (TOPROL-XL) 25 MG 24 hr tablet Take 25 mg by mouth daily.     naproxen sodium (ALEVE) 220 MG tablet 1 Add'l Sig oral Select Frequency     omeprazole (PRILOSEC) 20 MG capsule Take 20 mg by mouth daily.     Psyllium (METAMUCIL PO) Take by mouth.     rosuvastatin  (CRESTOR ) 40 MG tablet Take 1 tablet (40 mg total) by mouth daily. 90 tablet 3   terazosin (HYTRIN) 5 MG capsule terazosin 5 mg capsule     testosterone  (ANDROGEL ) 50 MG/5GM (1%) GEL Place 5 g onto the skin daily.     traMADol (ULTRAM) 50 MG tablet tramadol 50 mg tablet     losartan  (COZAAR ) 50 MG tablet Take 1 tablet (50 mg total) by mouth daily. 90 tablet 3   No current facility-administered medications for this visit.     Past Medical History:  Diagnosis Date   Adenomatous polyp 2007   Allergic rhinitis    Basal cell carcinoma (BCC) of back    BPH (benign prostatic hyperplasia)    Erectile dysfunction    GERD (gastroesophageal reflux  disease)    Hypertension    Low testosterone     Osteoporosis    Sciatica    folowed by dr. Leighton Punches   Vitamin D deficiency     Past Surgical History:  Procedure Laterality Date   COLONOSCOPY     TONSILLECTOMY AND ADENOIDECTOMY  1947   VASECTOMY      Social History   Socioeconomic History   Marital status: Widowed    Spouse name: Not on file   Number of children: 2   Years of education: Not on file   Highest education level: Not on file  Occupational History   Not on file  Tobacco Use   Smoking status: Never   Smokeless tobacco: Never  Substance and Sexual Activity   Alcohol use: Never   Drug use: Not on file   Sexual activity: Not on file  Other Topics Concern   Not on file  Social History Narrative   Not on file   Social Drivers of Health   Financial Resource Strain: Not on file  Food Insecurity: Not on file  Transportation Needs: Not on file  Physical Activity: Not on file  Stress: Not on file  Social Connections: Not on file  Intimate Partner Violence: Not on file    Family History  Problem Relation Age of Onset   Heart disease Mother  Hypertension Father    Heart disease Brother    Heart attack Brother     ROS: no fevers or chills, productive cough, hemoptysis, dysphasia, odynophagia, melena, hematochezia, dysuria, hematuria, rash, seizure activity, orthopnea, PND, pedal edema, claudication. Remaining systems are negative.  Physical Exam: Well-developed well-nourished in no acute distress.  Skin is warm and dry.  HEENT is normal.  Neck is supple.  Chest is clear to auscultation with normal expansion.  Cardiovascular exam is regular rate and rhythm.  Abdominal exam nontender or distended. No masses palpated. Extremities show no edema. neuro grossly intact  EKG Interpretation Date/Time:  Monday April 12 2023 07:47:23 EST Ventricular Rate:  73 PR Interval:  160 QRS Duration:  146 QT Interval:  426 QTC Calculation: 469 R  Axis:   -6  Text Interpretation: Normal sinus rhythm Right bundle branch block Inferior infarct (cited on or before 03-Apr-2004) When compared with ECG of 03-Apr-2004 05:54, Right bundle branch block is now Present Questionable change in initial forces of Inferior leads Confirmed by Alexandria Angel ([id number]) on 04/12/2023 7:48:34 AM    A/P  1 history of chest pain-no recurrences since last office visit.  Previous nuclear study showed no ischemia.  Patient does have elevated calcium  score.  Will continue aspirin and statin.  2 hypertension-blood pressure controlled.  Continue present medications.  Will have most recent potassium and renal function forwarded to us  from primary care for review.  3 hyperlipidemia-continue statin.  Will have most recent lipids and liver forwarded to us  from primary care for review.  4 palpitations-continue beta-blocker.  5 coronary artery disease-based on elevated calcium  score.  Continue medical therapy with aspirin and statin.  Alexandria Angel, MD

## 2023-04-01 DIAGNOSIS — E782 Mixed hyperlipidemia: Secondary | ICD-10-CM | POA: Diagnosis not present

## 2023-04-01 DIAGNOSIS — K219 Gastro-esophageal reflux disease without esophagitis: Secondary | ICD-10-CM | POA: Diagnosis not present

## 2023-04-01 DIAGNOSIS — R7309 Other abnormal glucose: Secondary | ICD-10-CM | POA: Diagnosis not present

## 2023-04-01 DIAGNOSIS — Z0000 Encounter for general adult medical examination without abnormal findings: Secondary | ICD-10-CM | POA: Diagnosis not present

## 2023-04-01 DIAGNOSIS — E559 Vitamin D deficiency, unspecified: Secondary | ICD-10-CM | POA: Diagnosis not present

## 2023-04-01 DIAGNOSIS — I10 Essential (primary) hypertension: Secondary | ICD-10-CM | POA: Diagnosis not present

## 2023-04-01 DIAGNOSIS — M545 Low back pain, unspecified: Secondary | ICD-10-CM | POA: Diagnosis not present

## 2023-04-01 DIAGNOSIS — G4700 Insomnia, unspecified: Secondary | ICD-10-CM | POA: Diagnosis not present

## 2023-04-01 DIAGNOSIS — E039 Hypothyroidism, unspecified: Secondary | ICD-10-CM | POA: Diagnosis not present

## 2023-04-12 ENCOUNTER — Encounter: Payer: Self-pay | Admitting: Cardiology

## 2023-04-12 ENCOUNTER — Ambulatory Visit: Payer: Medicare Other | Attending: Cardiology | Admitting: Cardiology

## 2023-04-12 VITALS — BP 128/60 | HR 73 | Ht 69.0 in | Wt 208.4 lb

## 2023-04-12 DIAGNOSIS — R002 Palpitations: Secondary | ICD-10-CM

## 2023-04-12 DIAGNOSIS — I2510 Atherosclerotic heart disease of native coronary artery without angina pectoris: Secondary | ICD-10-CM | POA: Diagnosis not present

## 2023-04-12 DIAGNOSIS — I10 Essential (primary) hypertension: Secondary | ICD-10-CM | POA: Diagnosis not present

## 2023-04-12 DIAGNOSIS — E785 Hyperlipidemia, unspecified: Secondary | ICD-10-CM

## 2023-04-12 NOTE — Patient Instructions (Signed)

## 2023-04-28 DIAGNOSIS — L814 Other melanin hyperpigmentation: Secondary | ICD-10-CM | POA: Diagnosis not present

## 2023-04-28 DIAGNOSIS — Z85820 Personal history of malignant melanoma of skin: Secondary | ICD-10-CM | POA: Diagnosis not present

## 2023-04-28 DIAGNOSIS — L821 Other seborrheic keratosis: Secondary | ICD-10-CM | POA: Diagnosis not present

## 2023-04-28 DIAGNOSIS — Z08 Encounter for follow-up examination after completed treatment for malignant neoplasm: Secondary | ICD-10-CM | POA: Diagnosis not present

## 2023-04-28 DIAGNOSIS — Z85828 Personal history of other malignant neoplasm of skin: Secondary | ICD-10-CM | POA: Diagnosis not present

## 2023-04-28 DIAGNOSIS — D225 Melanocytic nevi of trunk: Secondary | ICD-10-CM | POA: Diagnosis not present

## 2023-04-28 DIAGNOSIS — L570 Actinic keratosis: Secondary | ICD-10-CM | POA: Diagnosis not present

## 2023-05-03 DIAGNOSIS — H40051 Ocular hypertension, right eye: Secondary | ICD-10-CM | POA: Diagnosis not present

## 2023-05-03 DIAGNOSIS — H40011 Open angle with borderline findings, low risk, right eye: Secondary | ICD-10-CM | POA: Diagnosis not present

## 2023-05-03 DIAGNOSIS — H21231 Degeneration of iris (pigmentary), right eye: Secondary | ICD-10-CM | POA: Diagnosis not present

## 2023-05-10 DIAGNOSIS — E559 Vitamin D deficiency, unspecified: Secondary | ICD-10-CM | POA: Diagnosis not present

## 2023-05-10 DIAGNOSIS — E782 Mixed hyperlipidemia: Secondary | ICD-10-CM | POA: Diagnosis not present

## 2023-05-10 DIAGNOSIS — E1165 Type 2 diabetes mellitus with hyperglycemia: Secondary | ICD-10-CM | POA: Diagnosis not present

## 2023-07-19 DIAGNOSIS — E1165 Type 2 diabetes mellitus with hyperglycemia: Secondary | ICD-10-CM | POA: Diagnosis not present

## 2023-07-19 DIAGNOSIS — E559 Vitamin D deficiency, unspecified: Secondary | ICD-10-CM | POA: Diagnosis not present

## 2023-07-19 DIAGNOSIS — I10 Essential (primary) hypertension: Secondary | ICD-10-CM | POA: Diagnosis not present

## 2023-07-19 DIAGNOSIS — K219 Gastro-esophageal reflux disease without esophagitis: Secondary | ICD-10-CM | POA: Diagnosis not present

## 2023-07-19 DIAGNOSIS — E782 Mixed hyperlipidemia: Secondary | ICD-10-CM | POA: Diagnosis not present

## 2023-07-19 DIAGNOSIS — Z85820 Personal history of malignant melanoma of skin: Secondary | ICD-10-CM | POA: Diagnosis not present

## 2023-07-19 DIAGNOSIS — G4700 Insomnia, unspecified: Secondary | ICD-10-CM | POA: Diagnosis not present

## 2023-07-19 DIAGNOSIS — M545 Low back pain, unspecified: Secondary | ICD-10-CM | POA: Diagnosis not present

## 2023-07-19 DIAGNOSIS — E039 Hypothyroidism, unspecified: Secondary | ICD-10-CM | POA: Diagnosis not present

## 2023-08-02 DIAGNOSIS — H401111 Primary open-angle glaucoma, right eye, mild stage: Secondary | ICD-10-CM | POA: Diagnosis not present

## 2023-08-02 DIAGNOSIS — H401311 Pigmentary glaucoma, right eye, mild stage: Secondary | ICD-10-CM | POA: Diagnosis not present

## 2023-10-04 DIAGNOSIS — E559 Vitamin D deficiency, unspecified: Secondary | ICD-10-CM | POA: Diagnosis not present

## 2023-10-04 DIAGNOSIS — M8588 Other specified disorders of bone density and structure, other site: Secondary | ICD-10-CM | POA: Diagnosis not present

## 2023-10-04 DIAGNOSIS — M545 Low back pain, unspecified: Secondary | ICD-10-CM | POA: Diagnosis not present

## 2023-10-04 DIAGNOSIS — E039 Hypothyroidism, unspecified: Secondary | ICD-10-CM | POA: Diagnosis not present

## 2023-10-04 DIAGNOSIS — G4700 Insomnia, unspecified: Secondary | ICD-10-CM | POA: Diagnosis not present

## 2023-10-04 DIAGNOSIS — I10 Essential (primary) hypertension: Secondary | ICD-10-CM | POA: Diagnosis not present

## 2023-10-04 DIAGNOSIS — E782 Mixed hyperlipidemia: Secondary | ICD-10-CM | POA: Diagnosis not present

## 2023-10-04 DIAGNOSIS — E1165 Type 2 diabetes mellitus with hyperglycemia: Secondary | ICD-10-CM | POA: Diagnosis not present

## 2023-10-04 DIAGNOSIS — Z85820 Personal history of malignant melanoma of skin: Secondary | ICD-10-CM | POA: Diagnosis not present

## 2023-10-04 DIAGNOSIS — K219 Gastro-esophageal reflux disease without esophagitis: Secondary | ICD-10-CM | POA: Diagnosis not present

## 2023-10-05 DIAGNOSIS — H401131 Primary open-angle glaucoma, bilateral, mild stage: Secondary | ICD-10-CM | POA: Diagnosis not present

## 2023-10-26 DIAGNOSIS — D492 Neoplasm of unspecified behavior of bone, soft tissue, and skin: Secondary | ICD-10-CM | POA: Diagnosis not present

## 2023-10-26 DIAGNOSIS — L570 Actinic keratosis: Secondary | ICD-10-CM | POA: Diagnosis not present

## 2023-10-26 DIAGNOSIS — L538 Other specified erythematous conditions: Secondary | ICD-10-CM | POA: Diagnosis not present

## 2023-10-26 DIAGNOSIS — C4442 Squamous cell carcinoma of skin of scalp and neck: Secondary | ICD-10-CM | POA: Diagnosis not present

## 2023-10-28 DIAGNOSIS — E1165 Type 2 diabetes mellitus with hyperglycemia: Secondary | ICD-10-CM | POA: Diagnosis not present

## 2023-11-03 DIAGNOSIS — C4442 Squamous cell carcinoma of skin of scalp and neck: Secondary | ICD-10-CM | POA: Diagnosis not present

## 2023-11-03 DIAGNOSIS — L570 Actinic keratosis: Secondary | ICD-10-CM | POA: Diagnosis not present

## 2023-11-09 DIAGNOSIS — M8589 Other specified disorders of bone density and structure, multiple sites: Secondary | ICD-10-CM | POA: Diagnosis not present

## 2023-11-09 DIAGNOSIS — M8588 Other specified disorders of bone density and structure, other site: Secondary | ICD-10-CM | POA: Diagnosis not present

## 2023-12-20 DIAGNOSIS — L905 Scar conditions and fibrosis of skin: Secondary | ICD-10-CM | POA: Diagnosis not present

## 2023-12-20 DIAGNOSIS — C44529 Squamous cell carcinoma of skin of other part of trunk: Secondary | ICD-10-CM | POA: Diagnosis not present

## 2024-04-20 ENCOUNTER — Ambulatory Visit: Admitting: Cardiology
